# Patient Record
Sex: Male | Born: 2000 | Hispanic: Yes | Marital: Single | Smoking: Never smoker
Health system: Southern US, Community
[De-identification: ages and names within clinical notes are randomized; demographics above are authoritative.]

## PROBLEM LIST (undated history)

## (undated) DIAGNOSIS — K219 Gastro-esophageal reflux disease without esophagitis: Secondary | ICD-10-CM

## (undated) DIAGNOSIS — K5900 Constipation, unspecified: Secondary | ICD-10-CM

## (undated) HISTORY — DX: Constipation, unspecified: K59.00

## (undated) HISTORY — DX: Gastro-esophageal reflux disease without esophagitis: K21.9

---
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
## 2022-02-08 ENCOUNTER — Ambulatory Visit
Admission: EM | Admit: 2022-02-08 | Discharge: 2022-02-08 | Disposition: A | Discharge status: Home / Self Care | Payer: Medicaid Other | Attending: Family Medicine | Admitting: Family Medicine

## 2022-02-08 DIAGNOSIS — J029 Acute pharyngitis, unspecified: Secondary | ICD-10-CM | POA: Insufficient documentation

## 2022-02-08 DIAGNOSIS — Z20828 Contact with and (suspected) exposure to other viral communicable diseases: Secondary | ICD-10-CM | POA: Insufficient documentation

## 2022-02-08 DIAGNOSIS — J069 Acute upper respiratory infection, unspecified: Secondary | ICD-10-CM | POA: Insufficient documentation

## 2022-02-08 DIAGNOSIS — R000 Tachycardia, unspecified: Secondary | ICD-10-CM | POA: Insufficient documentation

## 2022-02-08 DIAGNOSIS — R509 Fever, unspecified: Secondary | ICD-10-CM | POA: Diagnosis not present

## 2022-02-08 LAB — POCT RAPID STREP A (OFFICE): Rapid Strep A Screen: NEGATIVE

## 2022-02-08 MED ORDER — LIDOCAINE VISCOUS HCL 2 % MT SOLN: 10.0000 mL | OROMUCOSAL | 0 refills | Status: AC | PRN

## 2022-02-08 NOTE — ED Provider Notes (Signed)
?[facility] ? ? ? ?CSN: [id number] ?Arrival date & time: 02/08/22  6283 ? ? ?  ? ?History   ?Chief Complaint ?Chief Complaint  ?Patient presents with  ? Fever  ?  Cough, fever and sore throat  ? ? ?HPI ?Jermaine Leonard is a 21 y.o. male.  ? ?Presenting today with about 4-day history of fever, cough, sore throat, occasional body aches.  Denies chest pain, shortness of breath, abdominal pain, nausea vomiting or diarrhea.  Trying Tylenol and hot tea with minimal relief.  No known sick contacts recently.  No known pertinent chronic medical problems. ? ?Past Medical History:  ?Diagnosis Date  ? Constipation   ? Gastroesophageal reflux   ? ? ?Patient Active Problem List  ? Diagnosis Date Noted  ? Pityriasis rosea 04/12/2013  ? Gastroesophageal reflux   ? Constipation   ? ? ?History reviewed. No pertinent surgical history. ? ? ? ? ?Home Medications   ? ?Prior to Admission medications   ?Medication Sig Start Date End Date Taking? Authorizing Provider  ?lidocaine (XYLOCAINE) 2 % solution Use as directed 10 mLs in the mouth or throat every 3 (three) hours as needed for mouth pain. 02/08/22  Yes Particia Nearing, PA-C  ?acetaminophen (TYLENOL) 325 MG tablet Take 650 mg by mouth every 6 (six) hours as needed.    [provider]  ?benzonatate (TESSALON) 100 MG capsule Take 1-2 capsules (100-200 mg total) by mouth 3 (three) times daily as needed for cough. 10/17/21   Wallis Bamberg, PA-C  ?cetirizine (ZYRTEC ALLERGY) 10 MG tablet Take 1 tablet (10 mg total) by mouth daily. 10/17/21   Wallis Bamberg, PA-C  ?fluticasone (FLONASE) 50 MCG/ACT nasal spray Place 2 sprays into both nostrils daily. 10/15/21   Domenick Gong, MD  ?promethazine-dextromethorphan (PROMETHAZINE-DM) 6.25-15 MG/5ML syrup Take 5 mLs by mouth at bedtime as needed for cough. 10/17/21   Wallis Bamberg, PA-C  ?pseudoephedrine (SUDAFED) 60 MG tablet Take 1 tablet (60 mg total) by mouth every 8 (eight) hours as needed for congestion. 10/17/21    Wallis Bamberg, PA-C  ?triamcinolone cream (KENALOG) 0.1 % Apply BID to rash 01/03/20   Merlyn Albert, MD  ? ? ?Family History ?Family History  ?Problem Relation Age of Onset  ? Diabetes Father   ? ? ?Social History ?Social History  ? ?Tobacco Use  ? Smoking status: Never  ?  Passive exposure: Never  ? Smokeless tobacco: Never  ?Vaping Use  ? Vaping Use: Never used  ?Substance Use Topics  ? Alcohol use: No  ? Drug use: No  ? ? ? ?Allergies   ?Patient has no known allergies. ? ? ?Review of Systems ?Review of Systems ?Per HPI ? ?Physical Exam ?Triage Vital Signs ?ED Triage Vitals  ?Enc Vitals Group  ?   BP 02/08/22 0828 118/79  ?   Pulse Rate 02/08/22 0828 (!) 116  ?   Resp 02/08/22 0828 20  ?   Temp 02/08/22 0828 99.4 ?F (37.4 ?C)  ?   Temp Source 02/08/22 0828 Oral  ?   SpO2 02/08/22 0828 98 %  ?   Weight --   ?   Height --   ?   Head Circumference --   ?   Peak Flow --   ?   Pain Score 02/08/22 0825 6  ?   Pain Loc --   ?   Pain Edu? --   ?   Excl. in GC? --   ? ?No data  found. ? ?Updated Vital Signs ?BP 118/79 (BP Location: Right Arm)   Pulse (!) 116   Temp 99.4 ?F (37.4 ?C) (Oral)   Resp 20   SpO2 98%  ? ?Visual Acuity ?Right Eye Distance:   ?Left Eye Distance:   ?Bilateral Distance:   ? ?Right Eye Near:   ?Left Eye Near:    ?Bilateral Near:    ? ?Physical Exam ?Vitals and nursing note reviewed.  ?Constitutional:   ?   Appearance: He is well-developed.  ?HENT:  ?   Head: Atraumatic.  ?   Right Ear: External ear normal.  ?   Left Ear: External ear normal.  ?   Nose: Nose normal.  ?   Mouth/Throat:  ?   Mouth: Mucous membranes are moist.  ?   Pharynx: Posterior oropharyngeal erythema present. No oropharyngeal exudate.  ?   Comments: Moderate bilateral tonsillar edema, erythema.  No exudates, uvula midline, oral airway patent ?Eyes:  ?   Conjunctiva/sclera: Conjunctivae normal.  ?   Pupils: Pupils are equal, round, and reactive to light.  ?Cardiovascular:  ?   Rate and Rhythm: Normal rate and regular rhythm.   ?Pulmonary:  ?   Effort: Pulmonary effort is normal. No respiratory distress.  ?   Breath sounds: No wheezing or rales.  ?Musculoskeletal:     ?   General: Normal range of motion.  ?   Cervical back: Normal range of motion and neck supple.  ?Lymphadenopathy:  ?   Cervical: No cervical adenopathy.  ?Skin: ?   General: Skin is warm and dry.  ?Neurological:  ?   Mental Status: He is alert and oriented to person, place, and time.  ?Psychiatric:     ?   Behavior: Behavior normal.  ? ?UC Treatments / Results  ?Labs ?(all labs ordered are listed, but only abnormal results are displayed) ?Labs Reviewed  ?COVID-19, FLU A+B NAA  ?CULTURE, GROUP A STREP [facility])  ?POCT RAPID STREP A (OFFICE)  ? ?EKG ? ?Radiology ?No results found. ? ?Procedures ?Procedures (including critical care time) ? ?Medications Ordered in UC ?Medications - No data to display ? ?Initial Impression / Assessment and Plan / UC Course  ?I have reviewed the triage vital signs and the nursing notes. ? ?Pertinent labs & imaging results that were available during my care of the patient were reviewed by me and considered in my medical decision making (see chart for details). ? ?  ? ?Tachycardic in triage, otherwise vital signs reassuring.  Possibly viral respiratory infection causing symptoms, rapid strep negative, throat culture and COVID flu test pending.  We will treat with viscous lidocaine, supportive over-the-counter medications and home care.  Return for acutely worsening symptoms. ? ?Final Clinical Impressions(s) / UC Diagnoses  ? ?Final diagnoses:  ?Exposure to the flu  ?Viral URI with cough  ?Fever, unspecified  ?Tachycardia  ?Sore throat  ? ?Discharge Instructions   ?None ?  ? ?ED Prescriptions   ? ? Medication Sig Dispense Auth. Provider  ? lidocaine (XYLOCAINE) 2 % solution Use as directed 10 mLs in the mouth or throat every 3 (three) hours as needed for mouth pain. 100 mL Particia Nearing, [place]  ? ?  ? ?PDMP not reviewed this encounter. ?   ?Particia Nearing, PA-C ?02/08/22 2094 ? ?

## 2022-02-08 NOTE — ED Triage Notes (Signed)
Pt states that about 4 days ago he started having a fever ? ?Pt states that every day since then he has had a cough and sore throat ? ?Pt states he tried Tylenol and hot tea to sooth throat ?

## 2022-02-09 LAB — COVID-19, FLU A+B NAA
Influenza A, NAA: NOT DETECTED
Influenza B, NAA: NOT DETECTED
SARS-CoV-2, NAA: NOT DETECTED

## 2022-02-11 LAB — CULTURE, GROUP A STREP (THRC)

## 2022-12-01 ENCOUNTER — Ambulatory Visit (INDEPENDENT_AMBULATORY_CARE_PROVIDER_SITE_OTHER): Payer: Medicaid Other | Admitting: Family Medicine

## 2022-12-01 VITALS — BP 115/86 | HR 97 | Temp 98.5°F | Wt 246.8 lb

## 2022-12-01 DIAGNOSIS — R519 Headache, unspecified: Secondary | ICD-10-CM | POA: Diagnosis not present

## 2022-12-01 MED ORDER — IBUPROFEN 600 MG PO TABS: 600.0000 mg | ORAL_TABLET | Freq: Three times a day (TID) | ORAL | 0 refills | Status: AC | PRN

## 2022-12-01 NOTE — Patient Instructions (Signed)
Please do your labs We will let you know the results You may take ibuprofen 1 every 8 hours as needed for these headaches Ibuprofen is not meant for frequent use Tried to make sure you are eating healthy and keeping well-hydrated If your headaches are not getting better over the next 3 weeks please let us know If you should start having severe headaches in the middle of the night, vomiting with headaches, or progressively getting worse also please let us know  Take care-Dr. Sallee Lange

## 2022-12-01 NOTE — Progress Notes (Signed)
   Subjective:    Patient ID: Jermaine Leonard, male    DOB: 2001-10-06, 22 y.o.   MRN: [id number]  HPI Patient arrives today to discuss headaches.   Patient states he notices them when he bends over and mostly the headache is in forehead and temples.  Over the past 3 days he has noticed spells where when he bends over and tries to do things he feels a pressure in the front part of his head and in the temple areas that is aching there is no double vision with it no nausea vomiting no fever or chills.  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  If the symptoms do not improve over the course the next few weeks will need further intervention patient aware to follow-up if ongoing troubles

## 2022-12-02 LAB — BASIC METABOLIC PANEL
BUN/Creatinine Ratio: 21 — ABNORMAL HIGH (ref 9–20)
BUN: 15 mg/dL (ref 6–20)
CO2: 22 mmol/L (ref 20–29)
Calcium: 9.8 mg/dL (ref 8.7–10.2)
Chloride: 102 mmol/L (ref 96–106)
Creatinine, Ser: 0.71 mg/dL — ABNORMAL LOW (ref 0.76–1.27)
Glucose: 86 mg/dL (ref 70–99)
Potassium: 4.4 mmol/L (ref 3.5–5.2)
Sodium: 141 mmol/L (ref 134–144)
eGFR: 136 mL/min/{1.73_m2} (ref >59)

## 2022-12-02 LAB — SEDIMENTATION RATE: Sed Rate: 8 mm/hr (ref 0–15)

## 2024-05-09 ENCOUNTER — Ambulatory Visit: Payer: Self-pay | Admitting: Family Medicine

## 2024-05-10 ENCOUNTER — Encounter: Payer: Self-pay | Admitting: Family Medicine
